# Patient Record
Sex: Male | Born: 2000 | Race: Black or African American | Hispanic: No | Marital: Single | Smoking: Never smoker
Health system: Southern US, Community
[De-identification: ages and names within clinical notes are randomized; demographics above are authoritative.]

---
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
## 2022-05-29 DIAGNOSIS — Z419 Encounter for procedure for purposes other than remedying health state, unspecified: Secondary | ICD-10-CM | POA: Diagnosis not present

## 2022-06-29 DIAGNOSIS — Z419 Encounter for procedure for purposes other than remedying health state, unspecified: Secondary | ICD-10-CM | POA: Diagnosis not present

## 2022-07-30 DIAGNOSIS — Z419 Encounter for procedure for purposes other than remedying health state, unspecified: Secondary | ICD-10-CM | POA: Diagnosis not present

## 2022-08-28 DIAGNOSIS — Z419 Encounter for procedure for purposes other than remedying health state, unspecified: Secondary | ICD-10-CM | POA: Diagnosis not present

## 2022-09-28 DIAGNOSIS — Z419 Encounter for procedure for purposes other than remedying health state, unspecified: Secondary | ICD-10-CM | POA: Diagnosis not present

## 2022-10-28 DIAGNOSIS — Z419 Encounter for procedure for purposes other than remedying health state, unspecified: Secondary | ICD-10-CM | POA: Diagnosis not present

## 2022-11-28 DIAGNOSIS — Z419 Encounter for procedure for purposes other than remedying health state, unspecified: Secondary | ICD-10-CM | POA: Diagnosis not present

## 2022-12-28 DIAGNOSIS — Z419 Encounter for procedure for purposes other than remedying health state, unspecified: Secondary | ICD-10-CM | POA: Diagnosis not present

## 2022-12-30 DIAGNOSIS — Z113 Encounter for screening for infections with a predominantly sexual mode of transmission: Secondary | ICD-10-CM | POA: Diagnosis not present

## 2022-12-30 DIAGNOSIS — Z202 Contact with and (suspected) exposure to infections with a predominantly sexual mode of transmission: Secondary | ICD-10-CM | POA: Diagnosis not present

## 2023-01-25 DIAGNOSIS — Z7251 High risk heterosexual behavior: Secondary | ICD-10-CM | POA: Diagnosis not present

## 2023-01-28 DIAGNOSIS — Z419 Encounter for procedure for purposes other than remedying health state, unspecified: Secondary | ICD-10-CM | POA: Diagnosis not present

## 2023-02-08 DIAGNOSIS — Z113 Encounter for screening for infections with a predominantly sexual mode of transmission: Secondary | ICD-10-CM | POA: Diagnosis not present

## 2023-02-09 DIAGNOSIS — Z7251 High risk heterosexual behavior: Secondary | ICD-10-CM | POA: Diagnosis not present

## 2023-02-28 DIAGNOSIS — Z419 Encounter for procedure for purposes other than remedying health state, unspecified: Secondary | ICD-10-CM | POA: Diagnosis not present

## 2023-03-30 DIAGNOSIS — Z419 Encounter for procedure for purposes other than remedying health state, unspecified: Secondary | ICD-10-CM | POA: Diagnosis not present

## 2023-04-30 DIAGNOSIS — Z419 Encounter for procedure for purposes other than remedying health state, unspecified: Secondary | ICD-10-CM | POA: Diagnosis not present
